# Patient Record
Sex: Female | Born: 1980 | Race: Black or African American | Hispanic: No | Marital: Single | Smoking: Current every day smoker
Health system: Southern US, Community
[De-identification: ages and names within clinical notes are randomized; demographics above are authoritative.]

## PROBLEM LIST (undated history)

## (undated) DIAGNOSIS — J45909 Unspecified asthma, uncomplicated: Secondary | ICD-10-CM

## (undated) HISTORY — PX: TUBAL LIGATION: SHX77

## (undated) HISTORY — PX: CHOLECYSTECTOMY: SHX55

---

## 2015-07-08 ENCOUNTER — Emergency Department (HOSPITAL_COMMUNITY)
Admission: EM | Admit: 2015-07-08 | Discharge: 2015-07-08 | Disposition: A | Discharge status: Home / Self Care | Payer: Medicaid - Out of State | Attending: Emergency Medicine | Admitting: Emergency Medicine

## 2015-07-08 ENCOUNTER — Encounter (HOSPITAL_COMMUNITY): Payer: Self-pay

## 2015-07-08 DIAGNOSIS — J45909 Unspecified asthma, uncomplicated: Secondary | ICD-10-CM | POA: Insufficient documentation

## 2015-07-08 DIAGNOSIS — F17210 Nicotine dependence, cigarettes, uncomplicated: Secondary | ICD-10-CM | POA: Diagnosis not present

## 2015-07-08 DIAGNOSIS — Z880 Allergy status to penicillin: Secondary | ICD-10-CM | POA: Diagnosis not present

## 2015-07-08 DIAGNOSIS — Z760 Encounter for issue of repeat prescription: Secondary | ICD-10-CM | POA: Insufficient documentation

## 2015-07-08 DIAGNOSIS — R0602 Shortness of breath: Secondary | ICD-10-CM | POA: Diagnosis present

## 2015-07-08 HISTORY — DX: Unspecified asthma, uncomplicated: J45.909

## 2015-07-08 MED ORDER — ALBUTEROL SULFATE HFA 108 (90 BASE) MCG/ACT IN AERS
1.0000 | INHALATION_SPRAY | Freq: Four times a day (QID) | RESPIRATORY_TRACT | Status: DC | PRN
Start: 2015-07-08 — End: 2015-08-05

## 2015-07-08 MED ORDER — ALBUTEROL SULFATE HFA 108 (90 BASE) MCG/ACT IN AERS
2.0000 | INHALATION_SPRAY | RESPIRATORY_TRACT | Status: DC | PRN
  Administered 2015-07-08: 2 via RESPIRATORY_TRACT
  Filled 2015-07-08: qty 6.7

## 2015-07-08 NOTE — ED Notes (Signed)
Pt observed frequently clearing her throat and sniffing.

## 2015-07-08 NOTE — ED Provider Notes (Signed)
CSN: [id number]     Arrival date & time 07/08/15  1003 History   First MD Initiated Contact with Patient 07/08/15 1015     Chief Complaint  Patient presents with  . Shortness of Breath     (Consider location/radiation/quality/duration/timing/severity/associated sxs/prior Treatment) HPI   34 year old female with history of asthma who presents today for evaluation of shortness of breath. Patient states that she recently moved from [place] to [place] for the past 11 days. She has a significant history of asthma requiring albuterol treatment every hour year-round. She has been intubated and ICU stay twice in the past due to asthma complication. She ran out of her inhaler yesterday and when she went to the store to get a refill her [place] insurance does not cover for her inhaler. Therefore, she decided to come to the ED today requesting for an inhaler. She denies having any fever, chills, productive cough, worsening or change in her lung condition or having any other complaint. She denies having any significant shortness of breath at this time.  Past Medical History  Diagnosis Date  . Asthma    Past Surgical History  Procedure Laterality Date  . Cholecystectomy    . Tubal ligation     History reviewed. No pertinent family history. Social History  Substance Use Topics  . Smoking status: Current Every Day Smoker -- 0.50 packs/day for 15 years    Types: Cigarettes  . Smokeless tobacco: None  . Alcohol Use: No   OB History    No data available     Review of Systems  All other systems reviewed and are negative.     Allergies  Penicillins  Home Medications   Prior to Admission medications   Not on File   BP 134/76 mmHg  Pulse 78  Temp(Src) 98.4 F (36.9 C) (Oral)  Resp 18  Ht  (1.702 m)  Wt 202 lb (91.627 kg)  BMI 31.63 kg/m2  SpO2 100%  LMP 06/30/2015 Physical Exam  Constitutional: She appears well-developed and well-nourished. No distress.   HENT:  Head: Atraumatic.  Eyes: Conjunctivae are normal.  Neck: Normal range of motion. Neck supple. No tracheal deviation present.  Cardiovascular: Normal rate, regular rhythm and intact distal pulses.   Pulmonary/Chest: Effort normal and breath sounds normal. No respiratory distress. She has no wheezes. She has no rales. She exhibits no tenderness.  Abdominal: Soft. There is no tenderness.  Musculoskeletal: She exhibits no edema.  Lymphadenopathy:    She has no cervical adenopathy.  Neurological: She is alert.  Skin: No rash noted.  Psychiatric: She has a normal mood and affect.  Nursing note and vitals reviewed.   ED Course  Procedures (including critical care time)  Patient with significant history of asthma here requesting for a refill of her asthma inhaler. She is currently in no acute respiratory distress. She is able to speak in complete sentences. Her lung exam is unremarkable. She is afebrile with stable normal vital signs and satting at 100% on room air. I will refill her albuterol inhaler and will give her an inhaler in the ED as well. Outpatient resources provided.   MDM   Final diagnoses:  Encounter for medication refill    BP 144/92 mmHg  Pulse 77  Temp(Src) 98.4 F (36.9 C) (Oral)  Resp 18  Ht  (1.702 m)  Wt 202 lb (91.627 kg)  BMI 31.63 kg/m2  SpO2 100%  LMP 06/30/2015     Fayrene Helper, PA-C  07/08/15 1109  Alvira MondayErin Schlossman, MD 07/11/15 Ernestina [id number]

## 2015-07-08 NOTE — Discharge Instructions (Signed)
Medicine Refill at the Emergency Department °We have refilled your medicine today, but it is best for you to get refills through your primary health care provider's office. In the future, please plan ahead so you do not need to get refills from the emergency department. °If the medicine we refilled was a maintenance medicine, you may have received only enough to get you by until you are able to see your regular health care provider. °  °This information is not intended to replace advice given to you by your health care provider. Make sure you discuss any questions you have with your health care provider. °  °Document Released: 11/24/2003 Document Revised: 08/28/2014 Document Reviewed: 11/14/2013 °Elsevier Interactive Patient Education ©2016 Elsevier Inc. ° ° ° °Emergency Department Resource Guide °1) Find a Doctor and Pay Out of Pocket °Although you won't have to find out who is covered by your insurance plan, it is a good idea to ask around and get recommendations. You will then need to call the office and see if the doctor you have chosen will accept you as a new patient and what types of options they offer for patients who are self-pay. Some doctors offer discounts or will set up payment plans for their patients who do not have insurance, but you will need to ask so you aren't surprised when you get to your appointment. ° °2) Contact Your Local Health Department °Not all health departments have doctors that can see patients for sick visits, but many do, so it is worth a call to see if yours does. If you don't know where your local health department is, you can check in your phone book. The CDC also has a tool to help you locate your state's health department, and many state websites also have listings of all of their local health departments. ° °3) Find a Walk-in Clinic °If your illness is not likely to be very severe or complicated, you may want to try a walk in clinic. These are popping up all over the country  in pharmacies, drugstores, and shopping centers. They're usually staffed by nurse practitioners or physician assistants that have been trained to treat common illnesses and complaints. They're usually fairly quick and inexpensive. However, if you have serious medical issues or chronic medical problems, these are probably not your best option. ° °No Primary Care Doctor: °- Call [facility] at  832-8000 - they can help you locate a primary care doctor that  accepts your insurance, provides certain services, etc. °- Physician Referral Service- 1-800-533-3463 ° °Chronic Pain Problems: °Organization         Address  Phone   Notes  ° [facility]  (336) 297-2271 Patients need to be referred by their primary care doctor.  ° °Medication Assistance: °Organization         Address  Phone   Notes  °[place] [facility] [address] °[place], [place] [address] (336) 641-8030 --Must be a resident of [place] °-- Must have NO insurance coverage whatsoever (no Medicaid/ Medicare, etc.) °-- The pt. MUST have a primary care doctor that directs their care regularly and follows them in the community °  °[facility]  (866) 331-1348   °[employer]  (888) 892-1162   ° °Agencies that provide inexpensive medical care: °Organization         Address  Phone   Notes  °[facility]  (336) 832-8035   °[facility]    (  336) 832-7272   °[facility] [address] °[place], [place] [address] (336) 832-4777   °[facility] [address], °[place] (336) 271-4999   °[facility]    (336) 373-0678   °[facility]    (336) 272-1050   °[facility] ° [address], [place] Phone:  (336) 832-4444, Fax:  (336) 832-4440 Hours of Operation:  9 am - 6 pm, M-F.  Also accepts Medicaid/Medicare and self-pay.  °[facility] ° [address],  [place] Phone: (336) 832-3150, Fax: (336) 832-3151. Hours of Operation:  8:30 am - 5:30 pm, M-F.  Also accepts Medicaid and self-pay.  °[facility] [place] [address], [place] Phone: (336) 878-6027   °[facility] [address], [place], [place] (336)723-1848, Ext. 123 Mondays & Thursdays: 7-9 AM.  First 15 patients are seen on a first come, first serve basis. °  ° °Medicaid-accepting [place] Providers: ° °Organization         Address  Phone   Notes  °[facility] [address], [place] (336) 641-2100 Also accepts self-pay patients.  °[facility] [address], [place] ° (336) 856-9996   °[facility] [address], [place] (336) 288-8857   °[facility] [address] [place] [address], [place] (336) 299-7000   °Veita Bland [address], [place]  ° (336) 373-1557 Only accepts [place] Access Medicaid patients after they have their name applied to their card.  ° °Self-Pay (no insurance) in [place]: ° °Organization         Address  Phone   Notes  °Sickle Cell Patients, [facility] [address], [place] (336) 832-1970   °[facility] [address], [place] (336) 832-4400   °[facility] ° [address],  (336) 992-4800   °[facility]/Dr. Osei-Bonsu ° [address] [place] [address], [place] or [address], [place] (336) 841-8500 Phone number for both [place] and [place] locations is the same.  °[facility] [address], [place] (336) 299-0000   °[facility] [address] [place] [address],  or [address] (336) 852-7530 °(336) 878-2260   °[facility] [address],  (336) 350-1642, phone; (336) 294-5005, fax Sees patients 1st and 3rd Saturday of every month.  Must not  qualify for public or private insurance (i.e. Medicaid, Medicare, [place] Health Choice, Veterans' Benefits) • Household income should be no more than 200% of the poverty level •The clinic cannot treat you if you are pregnant or think you are pregnant • Sexually transmitted diseases are not treated at the clinic.  ° ° °Dental Care: °Organization         Address  Phone  Notes  °[facility] [address],  (336) 641-6152 Accepts children up to age 21 who are enrolled in Medicaid or [place] Health Choice; pregnant women with a Medicaid card; and children who have applied for Medicaid or [place] Health Choice, but were declined, whose parents can pay a reduced fee at time of service.  °[facility] [place]  [address], [place] (336) 641-7733 Accepts children up   to age 21 who are enrolled in Medicaid or [place] Health Choice; pregnant women with a Medicaid card; and children who have applied for Medicaid or [place] Health Choice, but were declined, whose parents can pay a reduced fee at time of service.  °[facility] ° [address], [place] (336) 641-4533 Patients are seen by appointment only. Walk-ins are not accepted. [facility] will see patients 18 years of age and older. °Monday - Tuesday (8am-5pm) °Most Wednesdays (8:30-5pm) °$30 per visit, cash only  °[facility] ° [address], [place] (336) 641-4533 Patients are seen by appointment only. Walk-ins are not accepted. [facility] will see patients 18 years of age and older. °One Wednesday Evening (Monthly: Volunteer Based).  $30 per visit, cash only  °[facility]  (919) 537-3737 for adults; Children under age 4, call Graduate Pediatric Dentistry at (919) [phone]. Children aged 4-14, please call (919) [phone] to request a pediatric application. ° Dental services are provided  in all areas of dental care including fillings, crowns and bridges, complete and partial dentures, implants, gum treatment, root canals, and extractions. Preventive care is also provided. Treatment is provided to both adults and children. °Patients are selected via a lottery and there is often a waiting list. °  °[facility] [address], °[place] ° (336) 763-8833 [email or web address] °  °[facility] [address], [place], [place] (336)723-1848, Ext. 123 Second and Fourth Thursday of each month, opens at 6:30 AM; Clinic ends at 9 AM.  Patients are seen on a first-come first-served basis, and a limited number are seen during each clinic.  ° °[facility] ° [address], [place], [place] (336) 723-7904   Eligibility Requirements °You must have lived in [place], [place], or [place] counties for at least the last three months. °  You cannot be eligible for state or federal sponsored healthcare insurance, including Veterans Administration, Medicaid, or Medicare. °  You generally cannot be eligible for healthcare insurance through your employer.  °  How to apply: °Eligibility screenings are held every Tuesday and Wednesday afternoon from 1:00 pm until 4:00 pm. You do not need an appointment for the interview!  °[facility] [address], [place], [place] 336-631-2330   °[facility]  336-342-8273   °[facility]  336-703-3100   °[facility]  336-570-6415   ° °Behavioral Health Resources in the Community: °Intensive Outpatient Programs °Organization         Address  Phone  Notes  °[facility] [address], [place], [place] 336-878-6098   °[facility] [address], [place], [place] 336-832-9800   °[facility] [address], [place], [place] ° 336-882-2125   °[facility] [address],  °, [place]  1-800-853-5163 or 336-641-4981   °Substance Abuse Resources °Organization         Address  Phone  Notes  °[facility]  336-882-2125   °[facility]  336-784-9470   °[facility]  336-285-9073   °[facility]  336-845-3988   °Residential & Outpatient Substance Abuse Program  1-800-659-3381   °Psychological Services °Organization         Address  Phone  Notes  °[facility]  336- 832-9600   °[facility]  336- 378-7881   °[facility]   [address], [place] 1-800-853-5163 or 336-641-4981   ° °Mobile Crisis Teams °Organization         Address  Phone  Notes  °[facility], Mobile Crisis Care Unit  1-877-626-1772   °Assertive °[facility] ° [address] [place], [place] 336-834-9664   °Sharon DeEsch [address] °[place] 336-554-5454   ° °Self-Help/Support Groups °Organization         Address  Phone             Notes  °[facility] - variety of support groups  336- 373-1402 Call for more information  °Narcotics Anonymous (NA), [facility] [address], °[place]  2 meetings at this location  ° °Residential Treatment Programs °Organization         Address  Phone  Notes  °[facility] [address],    °[place]  1-866-801-8205   °[facility] ° [address], [place], [place] 704-293-8524   °[facility] [address], [place] 336-845-3988 Admissions: 8am-3pm M-F  °[facility] [address]    °[place], [place] 336-841-1104   °[facility] [address], [place], [place] 336-379-7146   °[facility] [address]  °[place], [place] 336-285-9073   °[facility] - Intensive Outpatient [address], [place], [place] 336-852-3033   °[facility] [address]  °[place], [place] 1-877-615-2722 or  336-784-9470   °[facility] [address] [place], [place] 336-227-7417 Accepts Medicaid  °[facility] [address]  ° [place] 1-800-659-3381 Substance Abuse/Addiction Treatment  ° °[place] Behavioral Health Resources °Organization         Address  Phone  Notes  °[facility]  (888) 581-9988   °Julie Brannon, PhD [address] [place], [place]   (336) 349-5553 or (336) 951-0000   °[facility]   [address] °[place], [place] (336) 349-4454   °[facility] [address], [place], [place] (336) 342-8316 Insurance/Medicaid/sponsorship through [facility]  °[facility] [address]                                    [place], [place] (336) 342-8316 Therapy/tele-psych/case  °[facility] [address]  ° [place], [place] (336) 349-2233    °Dr. Arfeen  (336) 349-4544   °[facility] [place]  [employer] [place] [facility] 1) [address], [place] °2) [address], [place] °3)  [address], [place] (336) 349-3220 °(336) 342-7768 ° °(336) 342-8140   °[place] [facility] (336) 342-1394 or (336) 342-3537 (After Hours)    ° ° ° °

## 2015-07-08 NOTE — ED Notes (Signed)
Bed: [id number] Expected date:  Expected time:  Means of arrival:  Comments: 34 y/o asthma

## 2015-07-08 NOTE — ED Notes (Addendum)
Non productive cough x4 days. Pt has hx of asthma, Pt out of her albuterol rescue inhaler. Pt just moved here from [place] and states that her insurance wont cover her inhalers here. Pt is speaking in complete sentences and is in no obvious distress during triage. Pt is a current smoker.

## 2015-08-05 ENCOUNTER — Encounter (HOSPITAL_COMMUNITY): Payer: Self-pay | Admitting: Nurse Practitioner

## 2015-08-05 ENCOUNTER — Emergency Department (HOSPITAL_COMMUNITY): Payer: Medicaid - Out of State

## 2015-08-05 ENCOUNTER — Emergency Department (HOSPITAL_COMMUNITY)
Admission: EM | Admit: 2015-08-05 | Discharge: 2015-08-05 | Disposition: A | Discharge status: Home / Self Care | Payer: Medicaid - Out of State | Attending: Emergency Medicine | Admitting: Emergency Medicine

## 2015-08-05 DIAGNOSIS — R05 Cough: Secondary | ICD-10-CM | POA: Diagnosis present

## 2015-08-05 DIAGNOSIS — R000 Tachycardia, unspecified: Secondary | ICD-10-CM | POA: Insufficient documentation

## 2015-08-05 DIAGNOSIS — F17210 Nicotine dependence, cigarettes, uncomplicated: Secondary | ICD-10-CM | POA: Diagnosis not present

## 2015-08-05 DIAGNOSIS — Z880 Allergy status to penicillin: Secondary | ICD-10-CM | POA: Diagnosis not present

## 2015-08-05 DIAGNOSIS — J45901 Unspecified asthma with (acute) exacerbation: Secondary | ICD-10-CM

## 2015-08-05 MED ORDER — PREDNISONE 20 MG PO TABS: 40.0000 mg | ORAL_TABLET | Freq: Every day | ORAL | Status: AC

## 2015-08-05 MED ORDER — ALBUTEROL SULFATE HFA 108 (90 BASE) MCG/ACT IN AERS
2.0000 | INHALATION_SPRAY | RESPIRATORY_TRACT | Status: AC | PRN
Start: 2015-08-05 — End: ?

## 2015-08-05 MED ORDER — BENZONATATE 100 MG PO CAPS: 100.0000 mg | ORAL_CAPSULE | Freq: Three times a day (TID) | ORAL | Status: AC

## 2015-08-05 MED ORDER — PREDNISONE 20 MG PO TABS
40.0000 mg | ORAL_TABLET | Freq: Once | ORAL | Status: AC
  Administered 2015-08-05: 40 mg via ORAL
  Filled 2015-08-05: qty 2

## 2015-08-05 NOTE — ED Provider Notes (Signed)
CSN: [id number]     Arrival date & time 08/05/15  1820 History   First MD Initiated Contact with Patient 08/05/15 1834     Chief Complaint  Patient presents with  . Cough  . Asthma     (Consider location/radiation/quality/duration/timing/severity/associated sxs/prior Treatment) HPI Comments: The patient is 34 years old, she has a known history of asthma, she has been coughing for 4 days with wheezing, no fevers chills or swelling of the legs, she has been using her albuterol inhaler frequently throughout the day, she is having temporary relief but then the cough and wheezing comes back. The symptoms are persistent over the last 4 days, gradually worsening, they are not associated with lower extremity swelling, no headache, her appetite has been normal. No vomiting or diarrhea  Patient is a 34 y.o. female presenting with cough and asthma. The history is provided by the patient.  Cough Asthma    Past Medical History  Diagnosis Date  . Asthma    Past Surgical History  Procedure Laterality Date  . Cholecystectomy    . Tubal ligation     History reviewed. No pertinent family history. Social History  Substance Use Topics  . Smoking status: Current Every Day Smoker -- 0.50 packs/day for 15 years    Types: Cigarettes  . Smokeless tobacco: None  . Alcohol Use: No   OB History    No data available     Review of Systems  Respiratory: Positive for cough.   All other systems reviewed and are negative.     Allergies  Penicillins  Home Medications   Prior to Admission medications   Medication Sig Start Date End Date Taking? Authorizing Provider  ibuprofen (ADVIL,MOTRIN) 800 MG tablet Take 800 mg by mouth every 8 (eight) hours as needed for headache, mild pain or moderate pain.   Yes Historical Provider, MD  Pseudoeph-Doxylamine-DM-APAP (NYQUIL D COLD/FLU PO) Take 30 mLs by mouth at bedtime as needed (cough,cold,sleep).   Yes Historical Provider, MD  albuterol (PROVENTIL  HFA;VENTOLIN HFA) 108 (90 BASE) MCG/ACT inhaler Inhale 2 puffs into the lungs every 4 (four) hours as needed for wheezing or shortness of breath. 08/05/15   Eber HongBrian Amanie Mcculley, MD  predniSONE (DELTASONE) 20 MG tablet Take 2 tablets (40 mg total) by mouth daily. 08/05/15   Eber HongBrian Debrah Granderson, MD   BP 150/88 mmHg  Pulse 96  Temp(Src) 97.9 F (36.6 C) (Oral)  Resp 20  Ht 5\' 7"  (1.702 m)  Wt 210 lb (95.255 kg)  BMI 32.88 kg/m2  SpO2 97%  LMP 07/30/2015 Physical Exam  Constitutional: She appears well-developed and well-nourished. No distress.  HENT:  Head: Normocephalic and atraumatic.  Mouth/Throat: Oropharynx is clear and moist. No oropharyngeal exudate.  Eyes: Conjunctivae and EOM are normal. Pupils are equal, round, and reactive to light. Right eye exhibits no discharge. Left eye exhibits no discharge. No scleral icterus.  Neck: Normal range of motion. Neck supple. No JVD present. No thyromegaly present.  Cardiovascular: Regular rhythm, normal heart sounds and intact distal pulses.  Exam reveals no gallop and no friction rub.   No murmur heard. Mild tachycardia  Pulmonary/Chest: Effort normal. No respiratory distress. She has wheezes. She has no rales.  Mild end expiratory wheezing in all lung fields, speaks in full sentences  Abdominal: Soft. Bowel sounds are normal. She exhibits no distension and no mass. There is no tenderness.  Musculoskeletal: Normal range of motion. She exhibits no edema or tenderness.  Lymphadenopathy:    She has no  cervical adenopathy.  Neurological: She is alert. Coordination normal.  Skin: Skin is warm and dry. No rash noted. No erythema.  Psychiatric: She has a normal mood and affect. Her behavior is normal.  Nursing note and vitals reviewed.   ED Course  Procedures (including critical care time) Labs Review Labs Reviewed - No data to display  Imaging Review Dg Chest 2 View  08/05/2015  CLINICAL DATA:  Cough and chest pain for 4 days. EXAM: CHEST  2 VIEW  COMPARISON:  None. FINDINGS: The heart size and mediastinal contours are within normal limits. Both lungs are clear. No pneumothorax or pleural effusion is noted. The visualized skeletal structures are unremarkable. IMPRESSION: No active cardiopulmonary disease. Electronically Signed   By: Lupita Raider, M.D.   On: 08/05/2015 19:02   I have personally reviewed and evaluated these images and lab results as part of my medical decision-making.    MDM   Final diagnoses:  Asthma exacerbation    The patient has vital signs consistent with mild hypertension and mild tachypnea and mild tachycardia. She has no rales but has abnormal lung sounds and with frequent albuterol use, increasing cough will treat with prednisone, evaluate for pneumonia.  Otherwise well appearing  Pt given steroids, xray neg, stable for d/c.  Meds given in ED:  Medications  predniSONE (DELTASONE) tablet 40 mg (not administered)    New Prescriptions   ALBUTEROL (PROVENTIL HFA;VENTOLIN HFA) 108 (90 BASE) MCG/ACT INHALER    Inhale 2 puffs into the lungs every 4 (four) hours as needed for wheezing or shortness of breath.   PREDNISONE (DELTASONE) 20 MG TABLET    Take 2 tablets (40 mg total) by mouth daily.      Eber Hong, MD 08/05/15 [phone]

## 2015-08-05 NOTE — ED Notes (Signed)
Pt presents with c/o cough which she states is causing her pleuritic pain, also states she maybe an asthma excerebration secondary to cough, she tried her home inhaler without relief.

## 2015-08-05 NOTE — Discharge Instructions (Signed)

## 2015-08-08 ENCOUNTER — Emergency Department (HOSPITAL_COMMUNITY): Payer: Medicaid Other

## 2015-08-08 ENCOUNTER — Encounter (HOSPITAL_COMMUNITY): Payer: Self-pay | Admitting: Nurse Practitioner

## 2015-08-08 ENCOUNTER — Emergency Department (HOSPITAL_COMMUNITY)
Admission: EM | Admit: 2015-08-08 | Discharge: 2015-08-08 | Disposition: A | Discharge status: Home / Self Care | Payer: Medicaid Other | Attending: Emergency Medicine | Admitting: Emergency Medicine

## 2015-08-08 DIAGNOSIS — F17210 Nicotine dependence, cigarettes, uncomplicated: Secondary | ICD-10-CM | POA: Diagnosis not present

## 2015-08-08 DIAGNOSIS — J45909 Unspecified asthma, uncomplicated: Secondary | ICD-10-CM | POA: Insufficient documentation

## 2015-08-08 DIAGNOSIS — S60221A Contusion of right hand, initial encounter: Secondary | ICD-10-CM | POA: Diagnosis not present

## 2015-08-08 DIAGNOSIS — S6991XA Unspecified injury of right wrist, hand and finger(s), initial encounter: Secondary | ICD-10-CM | POA: Diagnosis present

## 2015-08-08 DIAGNOSIS — Z880 Allergy status to penicillin: Secondary | ICD-10-CM | POA: Diagnosis not present

## 2015-08-08 DIAGNOSIS — Z79899 Other long term (current) drug therapy: Secondary | ICD-10-CM | POA: Insufficient documentation

## 2015-08-08 DIAGNOSIS — Y9389 Activity, other specified: Secondary | ICD-10-CM | POA: Diagnosis not present

## 2015-08-08 DIAGNOSIS — M25531 Pain in right wrist: Secondary | ICD-10-CM

## 2015-08-08 DIAGNOSIS — Z7952 Long term (current) use of systemic steroids: Secondary | ICD-10-CM | POA: Insufficient documentation

## 2015-08-08 DIAGNOSIS — Y9289 Other specified places as the place of occurrence of the external cause: Secondary | ICD-10-CM | POA: Diagnosis not present

## 2015-08-08 DIAGNOSIS — Y998 Other external cause status: Secondary | ICD-10-CM | POA: Diagnosis not present

## 2015-08-08 MED ORDER — IBUPROFEN 200 MG PO TABS
600.0000 mg | ORAL_TABLET | Freq: Once | ORAL | Status: AC
  Administered 2015-08-08: 600 mg via ORAL
  Filled 2015-08-08: qty 3

## 2015-08-08 MED ORDER — IBUPROFEN 800 MG PO TABS: 800.0000 mg | ORAL_TABLET | Freq: Three times a day (TID) | ORAL | Status: AC | PRN
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
## 2015-08-08 NOTE — ED Notes (Signed)
PT DISCHARGED. INSTRUCTIONS AND PRESCRIPTION GIVEN. AAOX3. PT IN NO APPARENT DISTRESS. THE OPPORTUNITY TO ASK QUESTIONS WAS PROVIDED. 

## 2015-08-08 NOTE — Discharge Instructions (Signed)
Read the information below.  Use the prescribed medication as directed.  Please discuss all new medications with your pharmacist.  You may return to the Emergency Department at any time for worsening condition or any new symptoms that concern you.    If you develop uncontrolled pain, weakness or numbness of the extremity, severe discoloration of the skin, or you are unable to move your fingers, return to the ER for a recheck.     Your xrays did not show fracture or broken bones but the tenderness in your wrist concerns me that you may have a broken bone not showing up on xray. Please follow up with the hand specialist listed above.   Hand Contusion A hand contusion is a deep bruise on your hand area. Contusions are the result of an injury that caused bleeding under the skin. The contusion may turn blue, purple, or yellow. Minor injuries will give you a painless contusion, but more severe contusions may stay painful and swollen for a few weeks. CAUSES  A contusion is usually caused by a blow, trauma, or direct force to an area of the body. SYMPTOMS   Swelling and redness of the injured area.  Discoloration of the injured area.  Tenderness and soreness of the injured area.  Pain. DIAGNOSIS  The diagnosis can be made by taking a history and performing a physical exam. An X-ray, CT scan, or MRI may be needed to determine if there were any associated injuries, such as broken bones (fractures). TREATMENT  Often, the best treatment for a hand contusion is resting, elevating, icing, and applying cold compresses to the injured area. Over-the-counter medicines may also be recommended for pain control. HOME CARE INSTRUCTIONS   Put ice on the injured area.  Put ice in a plastic bag.  Place a towel between your skin and the bag.  Leave the ice on for 15-20 minutes, 03-04 times a day.  Only take over-the-counter or prescription medicines as directed by your caregiver. Your caregiver may recommend  avoiding anti-inflammatory medicines (aspirin, ibuprofen, and naproxen) for 48 hours because these medicines may increase bruising.  If told, use an elastic wrap as directed. This can help reduce swelling. You may remove the wrap for sleeping, showering, and bathing. If your fingers become numb, cold, or blue, take the wrap off and reapply it more loosely.  Elevate your hand with pillows to reduce swelling.  Avoid overusing your hand if it is painful. SEEK IMMEDIATE MEDICAL CARE IF:   You have increased redness, swelling, or pain in your hand.  Your swelling or pain is not relieved with medicines.  You have loss of feeling in your hand or are unable to move your fingers.  Your hand turns cold or blue.  You have pain when you move your fingers.  Your hand becomes warm to the touch.  Your contusion does not improve in 2 days. MAKE SURE YOU:   Understand these instructions.  Will watch your condition.  Will get help right away if you are not doing well or get worse.   This information is not intended to replace advice given to you by your health care provider. Make sure you discuss any questions you have with your health care provider.   Document Released: 01/27/2002 Document Revised: 05/01/2012 Document Reviewed: 01/29/2012 Elsevier Interactive Patient Education [employer]
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
## 2016-02-11 IMAGING — CR DG HAND COMPLETE 3+V*R*
3 series · 3 of 3 positions shown · non-contrast
Comparison: None.

CLINICAL DATA: Ulnar hand pain following injury last night.

EXAM:
RIGHT HAND - COMPLETE 3+ VIEW

[x hand pa right]
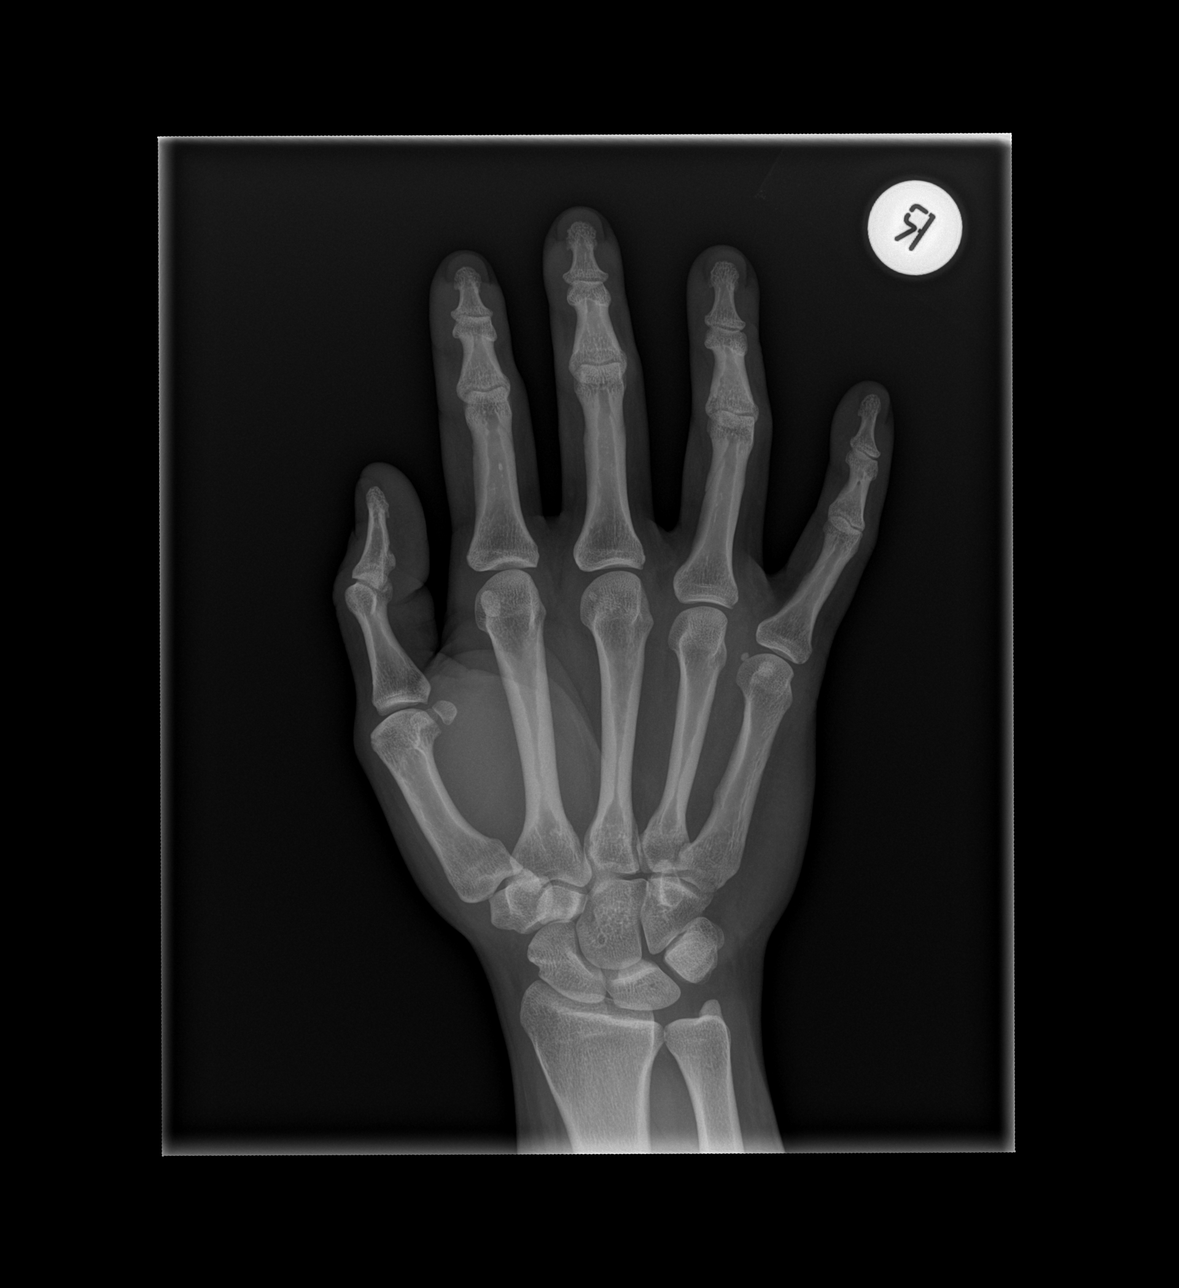

[x hand obl right]
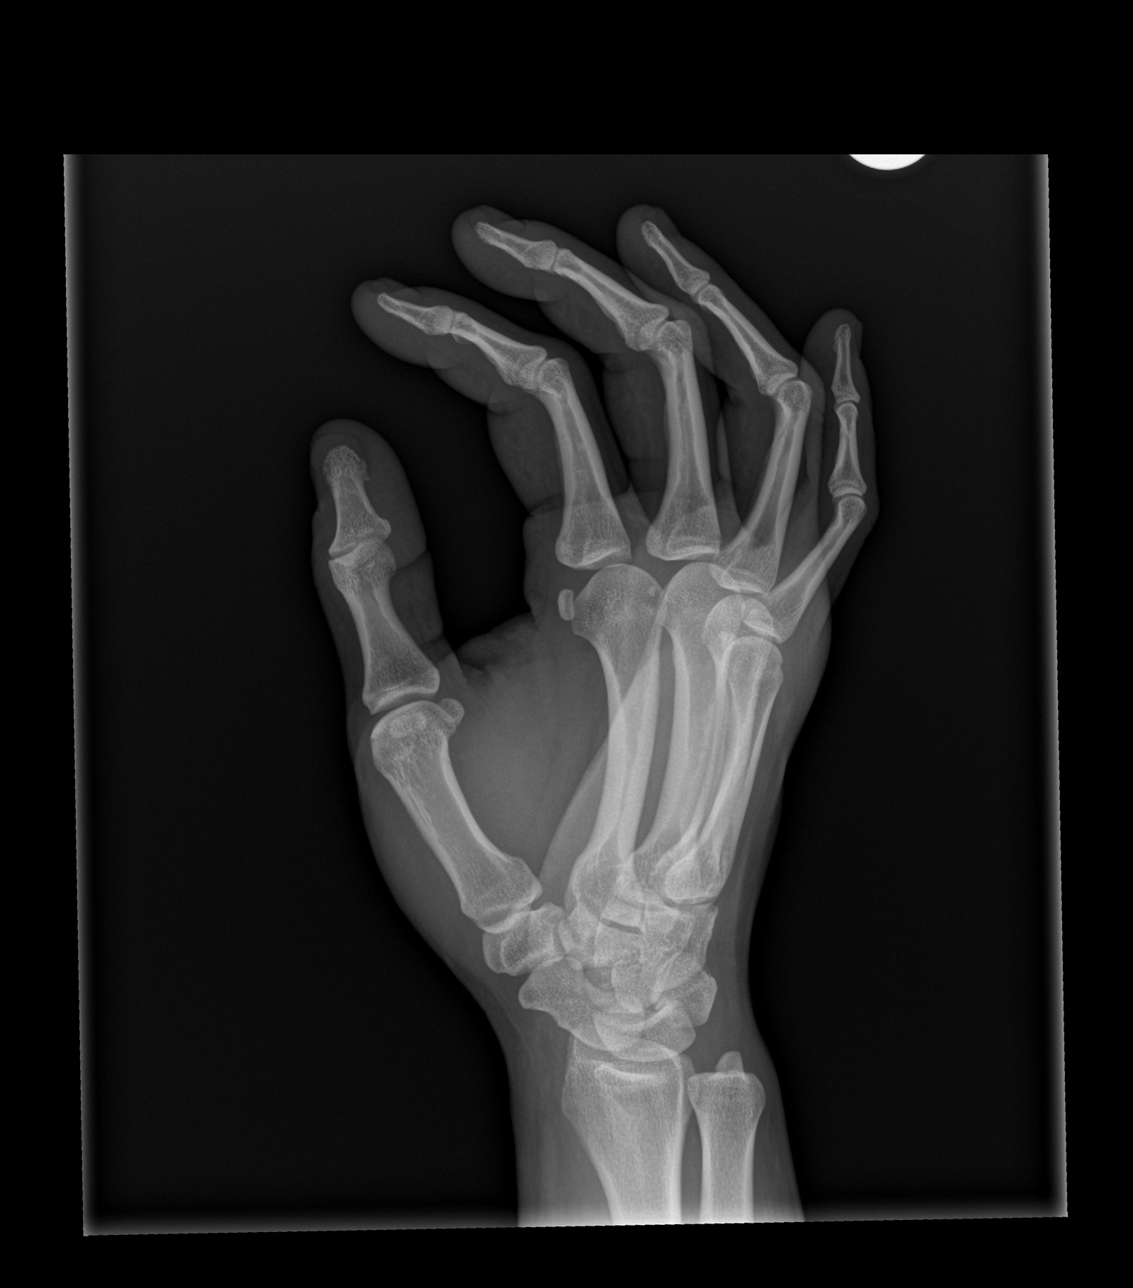

[x hand lat right]
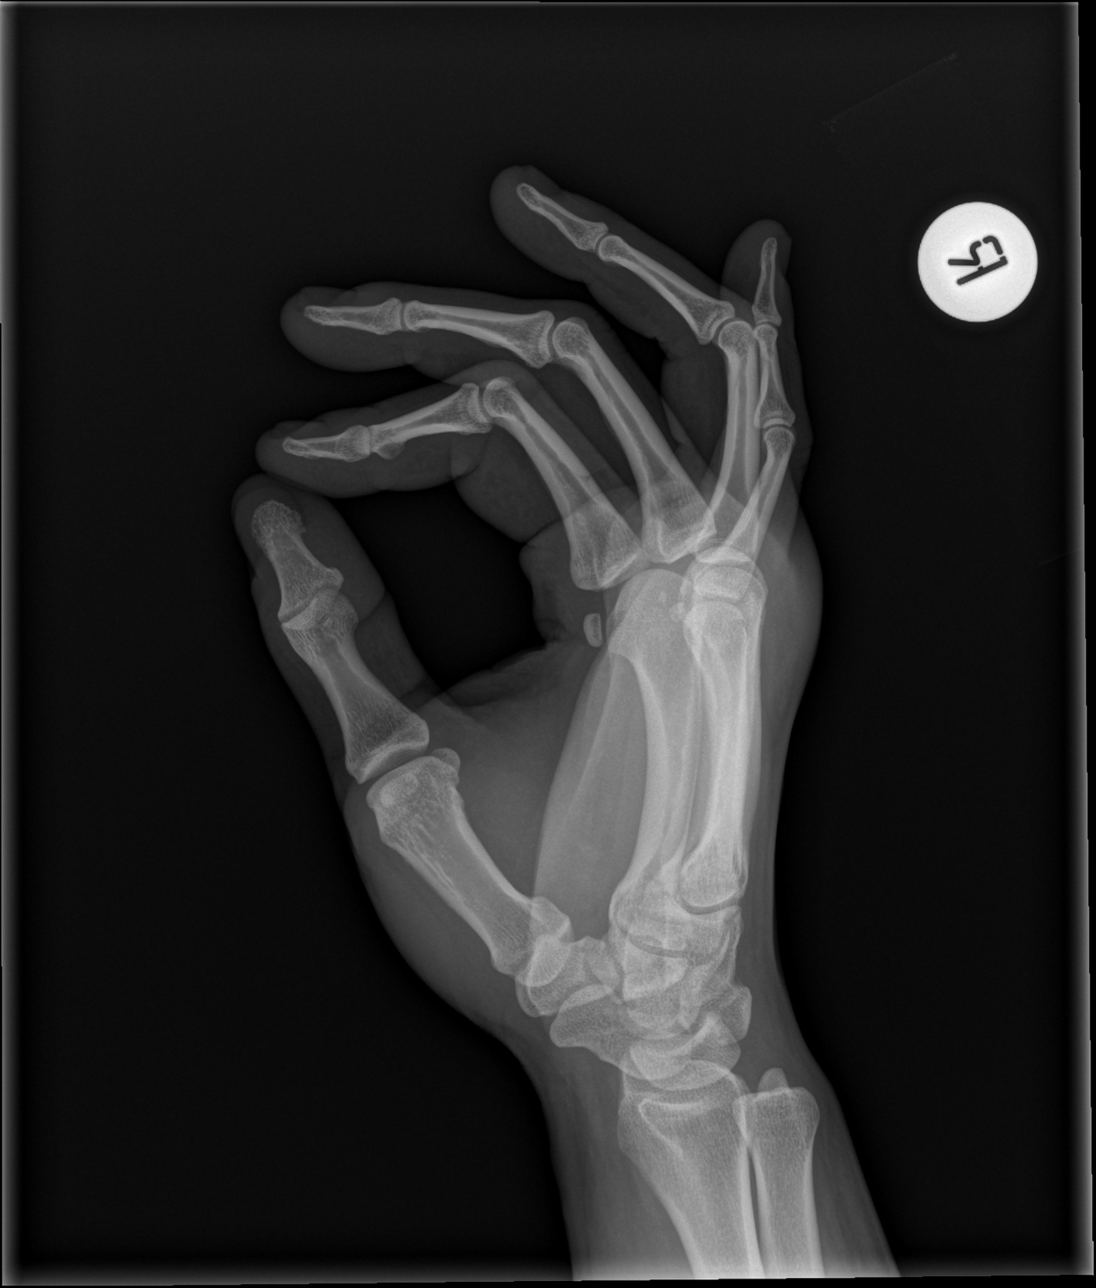

[3 of 3 positions shown; findings below may reference images not displayed]

FINDINGS: The mineralization and alignment are normal. There is no evidence of
acute fracture or dislocation. The joint spaces are maintained.
There is some dorsal soft tissue swelling over the MCP joints on the
lateral view.
IMPRESSION: Dorsal soft tissue swelling.  No acute osseous findings.
